# Patient Record
Sex: Female | Born: 1990 | Race: White | Hispanic: No | Marital: Single | Smoking: Never smoker
Health system: Southern US, Community
[De-identification: ages and names within clinical notes are randomized; demographics above are authoritative.]

## PROBLEM LIST (undated history)

## (undated) DIAGNOSIS — R200 Anesthesia of skin: Secondary | ICD-10-CM

## (undated) DIAGNOSIS — K219 Gastro-esophageal reflux disease without esophagitis: Secondary | ICD-10-CM

## (undated) DIAGNOSIS — F4001 Agoraphobia with panic disorder: Secondary | ICD-10-CM

## (undated) DIAGNOSIS — F329 Major depressive disorder, single episode, unspecified: Secondary | ICD-10-CM

## (undated) DIAGNOSIS — F32A Depression, unspecified: Secondary | ICD-10-CM

## (undated) DIAGNOSIS — F419 Anxiety disorder, unspecified: Secondary | ICD-10-CM

## (undated) DIAGNOSIS — R51 Headache: Secondary | ICD-10-CM

## (undated) DIAGNOSIS — R011 Cardiac murmur, unspecified: Secondary | ICD-10-CM

## (undated) DIAGNOSIS — R519 Headache, unspecified: Secondary | ICD-10-CM

## (undated) DIAGNOSIS — Z8719 Personal history of other diseases of the digestive system: Secondary | ICD-10-CM

## (undated) HISTORY — DX: Major depressive disorder, single episode, unspecified: F32.9

## (undated) HISTORY — DX: Depression, unspecified: F32.A

## (undated) HISTORY — DX: Anesthesia of skin: R20.0

## (undated) HISTORY — PX: TOOTH EXTRACTION: SUR596

## (undated) HISTORY — DX: Anxiety disorder, unspecified: F41.9

---

## 2012-12-04 ENCOUNTER — Encounter (HOSPITAL_COMMUNITY): Payer: Self-pay | Admitting: *Deleted

## 2012-12-04 ENCOUNTER — Emergency Department (HOSPITAL_COMMUNITY)
Admission: EM | Admit: 2012-12-04 | Discharge: 2012-12-04 | Disposition: A | Discharge status: Home / Self Care | Payer: Medicaid Other | Attending: Emergency Medicine | Admitting: Emergency Medicine

## 2012-12-04 DIAGNOSIS — Z880 Allergy status to penicillin: Secondary | ICD-10-CM | POA: Insufficient documentation

## 2012-12-04 DIAGNOSIS — N760 Acute vaginitis: Secondary | ICD-10-CM | POA: Insufficient documentation

## 2012-12-04 DIAGNOSIS — R350 Frequency of micturition: Secondary | ICD-10-CM | POA: Insufficient documentation

## 2012-12-04 DIAGNOSIS — Z3202 Encounter for pregnancy test, result negative: Secondary | ICD-10-CM | POA: Insufficient documentation

## 2012-12-04 DIAGNOSIS — B9689 Other specified bacterial agents as the cause of diseases classified elsewhere: Secondary | ICD-10-CM

## 2012-12-04 DIAGNOSIS — Z202 Contact with and (suspected) exposure to infections with a predominantly sexual mode of transmission: Secondary | ICD-10-CM

## 2012-12-04 DIAGNOSIS — R300 Dysuria: Secondary | ICD-10-CM | POA: Insufficient documentation

## 2012-12-04 DIAGNOSIS — Z79899 Other long term (current) drug therapy: Secondary | ICD-10-CM | POA: Insufficient documentation

## 2012-12-04 LAB — URINALYSIS, ROUTINE W REFLEX MICROSCOPIC
Bilirubin Urine: NEGATIVE
Hgb urine dipstick: NEGATIVE
Nitrite: NEGATIVE
Specific Gravity, Urine: >1.03 — ABNORMAL HIGH (ref 1.005–1.030)
pH: 6 (ref 5.0–8.0)

## 2012-12-04 LAB — WET PREP, GENITAL
Trich, Wet Prep: NONE SEEN
Yeast Wet Prep HPF POC: NONE SEEN

## 2012-12-04 LAB — URINE MICROSCOPIC-ADD ON

## 2012-12-04 MED ORDER — CEFTRIAXONE SODIUM 250 MG IJ SOLR
250.0000 mg | Freq: Once | INTRAMUSCULAR | Status: AC
  Administered 2012-12-04: 250 mg via INTRAMUSCULAR
  Filled 2012-12-04: qty 250

## 2012-12-04 MED ORDER — AZITHROMYCIN 250 MG PO TABS
1000.0000 mg | ORAL_TABLET | Freq: Once | ORAL | Status: AC
  Administered 2012-12-04: 1000 mg via ORAL
  Filled 2012-12-04: qty 4

## 2012-12-04 MED ORDER — LIDOCAINE HCL (PF) 1 % IJ SOLN
INTRAMUSCULAR | Status: AC
  Administered 2012-12-04: 2.1 mL
  Filled 2012-12-04: qty 5

## 2012-12-04 MED ORDER — METRONIDAZOLE 500 MG PO TABS: 500.0000 mg | ORAL_TABLET | Freq: Two times a day (BID) | ORAL | Status: DC

## 2012-12-04 NOTE — ED Notes (Signed)
Pt states she had unprotected sex last Saturday and now has foul smelling, yellowish discharge, itching, and urinary frequency. Cough and congestion x 1 month.

## 2012-12-04 NOTE — ED Provider Notes (Signed)
Medical screening examination/treatment/procedure(s) were performed by non-physician practitioner and as supervising physician I was immediately available for consultation/collaboration.  Niurka Benecke Joseph Analina Filla, MD 12/04/12 2239 

## 2012-12-04 NOTE — ED Provider Notes (Signed)
CSN: [id number]     Arrival date & time 12/04/12  1946 History     First MD Initiated Contact with Patient 12/04/12 1958     Chief Complaint  Patient presents with  . S74.5    (Consider location/radiation/quality/duration/timing/severity/associated sxs/prior Treatment) Patient is a 22 y.o. female presenting with vaginal discharge. The history is provided by the patient.  Vaginal Discharge Quality:  Yellow Severity:  Mild Onset quality:  Gradual Duration:  1 week Timing:  Constant Progression:  Unchanged Chronicity:  New Context: after intercourse   Context: not during urination, not genital trauma and not recent antibiotic use   Relieved by:  Nothing Worsened by:  Nothing tried Ineffective treatments:  None tried Associated symptoms: dysuria, urinary frequency and vaginal itching   Associated symptoms: no abdominal pain, no fever, no genital lesions, no nausea, no rash, no urinary hesitancy, no urinary incontinence and no vomiting   Risk factors: new sexual partner, STI exposure and unprotected sex   Risk factors: no foreign body, no gynecological surgery, no PID and no terminated pregnancy     History reviewed. No pertinent past medical history. History reviewed. No pertinent past surgical history. History reviewed. No pertinent family history. History  Substance Use Topics  . Smoking status: Never Smoker   . Smokeless tobacco: Not on file  . Alcohol Use: No   OB History   Grav Para Term Preterm Abortions TAB SAB Ect Mult Living                 Review of Systems  Constitutional: Negative for fever, chills, activity change and appetite change.  Respiratory: Negative for shortness of breath.   Cardiovascular: Negative for chest pain.  Gastrointestinal: Negative for nausea, vomiting, abdominal pain and abdominal distention.  Genitourinary: Positive for dysuria, frequency and vaginal discharge. Negative for bladder incontinence, hesitancy, hematuria, flank pain, vaginal  bleeding, difficulty urinating, genital sores, vaginal pain, menstrual problem and pelvic pain.  Musculoskeletal: Negative for back pain.  Skin: Negative for rash.  Hematological: Negative for adenopathy.  All other systems reviewed and are negative.    Allergies  Penicillins and Sulfa antibiotics  Home Medications   Current Outpatient Rx  Name  Route  Sig  Dispense  Refill  . FLUoxetine (PROZAC) 10 MG capsule   Oral   Take 30 mg by mouth daily.         . norethindrone-ethinyl estradiol (JUNEL FE,GILDESS FE,LOESTRIN FE) 1-20 MG-MCG tablet   Oral   Take 1 tablet by mouth daily.          BP 132/78  Pulse 85  Temp(Src) 98.7 F (37.1 C) (Oral)  Resp 20  Ht 5\' 4"  (1.626 m)  Wt 140 lb (63.504 kg)  BMI 24.02 kg/m2  SpO2 97% Physical Exam  Nursing note and vitals reviewed. Constitutional: She is oriented to person, place, and time. She appears well-developed and well-nourished. No distress.  HENT:  Head: Normocephalic and atraumatic.  Neck: Normal range of motion. Neck supple.  Cardiovascular: Normal rate, regular rhythm, normal heart sounds and intact distal pulses.   No murmur heard. Pulmonary/Chest: Effort normal and breath sounds normal. No respiratory distress.  Abdominal: Soft. She exhibits no distension and no mass. There is no tenderness. There is no rebound and no guarding.  Genitourinary: Uterus normal. There is no rash, tenderness, lesion or injury on the right labia. There is no rash, tenderness, lesion or injury on the left labia. Cervix exhibits no motion tenderness, no discharge and no friability.  Right adnexum displays no mass and no tenderness. Left adnexum displays no mass and no tenderness. No erythema or tenderness around the vagina. No foreign body around the vagina. Vaginal discharge found.  Mild white to yellow discharge in the vaginal vault. No cervical motion tenderness,  adnexal tenderness or masses.  No genital lesions.  Musculoskeletal: Normal  range of motion.  Neurological: She is alert and oriented to person, place, and time. She exhibits normal muscle tone. Coordination normal.  Skin: Skin is warm and dry. No erythema.    ED Course   Procedures (including critical care time)  Labs Reviewed  WET PREP, GENITAL - Abnormal; Notable for the following:    Clue Cells Wet Prep HPF POC FEW (*)    WBC, Wet Prep HPF POC MODERATE (*)    All other components within normal limits  URINALYSIS, ROUTINE W REFLEX MICROSCOPIC - Abnormal; Notable for the following:    Specific Gravity, Urine >1.030 (*)    Leukocytes, UA SMALL (*)    All other components within normal limits  URINE MICROSCOPIC-ADD ON - Abnormal; Notable for the following:    Squamous Epithelial / LPF MANY (*)    Bacteria, UA FEW (*)    All other components within normal limits  GC/CHLAMYDIA PROBE AMP  URINE CULTURE  PREGNANCY, URINE     MDM  Urine culture is pending  Patient reports history of unprotected intercourse last week and has since developed vaginal discharge with an odor.  She is concerned about possible STD. I will treat with IM Rocephin and  Zithromax.  I will also prescribe Flagyl for bacterial vaginosis.  No concerning symptoms for TOA or PID.     Vital signs are stable. Abdomen is soft and nontender. Patient is nontoxic appearing. Stable for discharge at this time. She agrees to followup with the health department or with her primary care physician.  Makynleigh Breslin L. Trisha Mangle, PA-C 12/04/12 2143

## 2012-12-06 LAB — URINE CULTURE

## 2012-12-06 LAB — GC/CHLAMYDIA PROBE AMP
CT Probe RNA: NEGATIVE
GC Probe RNA: NEGATIVE

## 2012-12-07 ENCOUNTER — Telehealth (HOSPITAL_COMMUNITY): Payer: Self-pay | Admitting: Emergency Medicine

## 2012-12-07 NOTE — ED Notes (Signed)
Post ED Visit - Positive Culture Follow-up  Culture report reviewed by antimicrobial stewardship pharmacist: []  Wes Dulaney, Pharm.D., BCPS []  Celedonio Miyamoto, Pharm.D., BCPS [x]  Georgina Pillion, [address].D., BCPS []  [place], [address].D., BCPS, AAHIVP []  Estella Husk, Pharm.D., BCPS, AAHIVP  Positive urine culture Per [facility], would recommend not treating, likely contaminant and no further patient follow-up is required at this time.  Kylie A Holland 12/07/2012, 2:34 PM

## 2012-12-07 NOTE — Progress Notes (Signed)
ED Antimicrobial Stewardship Positive Culture Follow Up   Casey Delgado is an 22 y.o. female who presented to [facility] on 12/04/2012 with a chief complaint of foul smelling urine with yellow discharge, itching, and urinary frequency  Chief Complaint  Patient presents with  . S74.5     Recent Results (from the past 720 hour(s))  URINE CULTURE     Status: None   Collection Time    12/04/12  8:00 PM      Result Value Range Status   Specimen Description URINE, CLEAN CATCH   Final   Special Requests NONE   Final   Culture  Setup Time     Final   Value: 12/04/2012 22:30     Performed at [facility] Count     Final   Value: 50,000 COLONIES/ML     Performed at [facility]   Culture     Final   Value: LACTOBACILLUS SPECIES     Note: Standardized susceptibility testing for this organism is not available.     Performed at [facility]   Report Status 12/06/2012 FINAL   Final  GC/CHLAMYDIA PROBE AMP     Status: None   Collection Time    12/04/12  8:36 PM      Result Value Range Status   CT Probe RNA NEGATIVE  NEGATIVE Final   GC Probe RNA NEGATIVE  NEGATIVE Final   Comment: (NOTE)                                                                                              **Normal Reference Range: Negative**          Assay performed using the Gen-Probe APTIMA COMBO2 (R) Assay.     Acceptable specimen types for this assay include APTIMA Swabs (Unisex,     endocervical, urethral, or vaginal), first void urine, and ThinPrep     liquid based cytology samples.     Performed at [facility]  WET PREP, GENITAL     Status: Abnormal   Collection Time    12/04/12  8:36 PM      Result Value Range Status   Yeast Wet Prep HPF POC NONE SEEN  NONE SEEN Final   Trich, Wet Prep NONE SEEN  NONE SEEN Final   Clue Cells Wet Prep HPF POC FEW (*) NONE SEEN Final   WBC, Wet Prep HPF POC MODERATE (*) NONE SEEN Final    [x]  No additional treatment  indicated  22 y.o. F who presented to the [facility] with foul smelling urine with yellow discharge, itching, and urinary frequency and was worked up for STDs. Given Flagyl to treat for BV -- UCx grew 50k of lactobacillus, UA showed many squamous cells, likely a contaminant.   New antibiotic prescription: Continue Flagyl as prescribed -- no new antibiotics needed.   ED Provider: Trixie Dredge, PA-C  [name] 12/07/2012, 1:39 PM Infectious Diseases Pharmacist Phone# [phone]

## 2015-02-02 ENCOUNTER — Ambulatory Visit (INDEPENDENT_AMBULATORY_CARE_PROVIDER_SITE_OTHER): Payer: Medicaid Other | Admitting: Neurology

## 2015-02-02 DIAGNOSIS — R4182 Altered mental status, unspecified: Secondary | ICD-10-CM | POA: Diagnosis not present

## 2015-02-02 DIAGNOSIS — R202 Paresthesia of skin: Secondary | ICD-10-CM

## 2015-02-02 NOTE — Procedures (Signed)
    History:  Casey Delgado is a 24 year old patient with a history of right-sided paresthesias, and a history of anxiety and depression, with racing thoughts. The patient is being evaluated for these issues.  This is a routine EEG. No skull defects are noted. Medications include birth control pills, and BuSpar.   EEG classification: Normal awake  Description of the recording: The background rhythms of this recording consists of a fairly well modulated medium amplitude alpha rhythm of 11 Hz that is reactive to eye opening and closure. As the record progresses, the patient appears to remain in the waking state throughout the recording. Photic stimulation was performed, resulting in a bilateral and symmetric photic driving response. Hyperventilation was also performed, resulting in a minimal buildup of the background rhythm activities without significant slowing seen. At no time during the recording does there appear to be evidence of spike or spike wave discharges or evidence of focal slowing. Throughout the recording, there appears to be excessive movement artifact. EKG monitor shows no evidence of cardiac rhythm abnormalities with a heart rate of 78.  Impression: This is a normal EEG recording in the waking state. No evidence of ictal or interictal discharges are seen. The recording is somewhat technically difficult secondary to excessive movement artifact throughout the recording.

## 2015-02-03 ENCOUNTER — Ambulatory Visit: Payer: Medicaid Other | Admitting: Neurology

## 2015-02-10 ENCOUNTER — Encounter: Payer: Self-pay | Admitting: Neurology

## 2015-02-10 ENCOUNTER — Ambulatory Visit (INDEPENDENT_AMBULATORY_CARE_PROVIDER_SITE_OTHER): Payer: Medicaid Other | Admitting: Neurology

## 2015-02-10 VITALS — BP 127/88 | HR 71 | Ht 64.0 in | Wt 133.0 lb

## 2015-02-10 DIAGNOSIS — G43709 Chronic migraine without aura, not intractable, without status migrainosus: Secondary | ICD-10-CM

## 2015-02-10 DIAGNOSIS — R202 Paresthesia of skin: Secondary | ICD-10-CM | POA: Diagnosis not present

## 2015-02-10 DIAGNOSIS — IMO0002 Reserved for concepts with insufficient information to code with codable children: Secondary | ICD-10-CM

## 2015-02-10 MED ORDER — NORTRIPTYLINE HCL 10 MG PO CAPS: ORAL_CAPSULE | ORAL | Status: DC

## 2015-02-10 MED ORDER — RIZATRIPTAN BENZOATE 5 MG PO TBDP: 5.0000 mg | ORAL_TABLET | ORAL | Status: DC | PRN

## 2015-02-10 NOTE — Progress Notes (Signed)
PATIENT: Casey Delgado DOB: October 12, 1990  Chief Complaint  Patient presents with  . Numbness    She has been having right-sided numbness and weakness.  Symptoms present for years.  She has recently had a EEG but she was unable to have a MRI due to it being denied twice by Medicaid.     HISTORICAL  Casey Delgado is a 24 years old right-handed female, seen in refer by  By her primary care nurse practitioner Casey Delgado for evaluation of numbness  In February 10 2015  She reported long-standing history of migraine, her typical migraine are bifrontal, temporal region pontine pressure headache was associated light noise sensitivity, nauseous, lasting for a few hours, worsening by movement, relieved by sleep, next  She started to have  Increased frequency of migraine to the point of daily basis since August 2016, she also complains of intermittent blurred vision, clumsiness during intense headaches,  She is concerned about possible MS   I have reviewed laboratory evaluation  In December 25 2014, normal TSH, CMP, CBC with a hemoglobin of 13 point 2, creatinine was 0.73   EEG was normal February 02 2015   she also complains significant depression anxiety symptoms, taking  BuSpar 10 mg every day without significant improvement   she also complains of intermittent paresthesia over the past 2 months, from head down, involving both left and right equally, no gait difficulty, no bowel and bladder incontinence, no strokelike symptoms, no sudden visual loss.  REVIEW OF SYSTEMS: Full 14 system review of systems performed and notable only for  As above  ALLERGIES: Allergies  Allergen Reactions  . Penicillins   . Sulfa Antibiotics     HOME MEDICATIONS: Current Outpatient Prescriptions  Medication Sig Dispense Refill  . busPIRone (BUSPAR) 10 MG tablet Take 10 mg by mouth 3 (three) times daily.    . norethindrone-ethinyl estradiol (JUNEL FE,GILDESS FE,LOESTRIN FE) 1-20 MG-MCG tablet  Take 1 tablet by mouth daily.     No current facility-administered medications for this visit.    PAST MEDICAL HISTORY: Past Medical History  Diagnosis Date  . Depression   . Anxiety   . Numbness     PAST SURGICAL HISTORY: Past Surgical History  Procedure Laterality Date  . No past surgeries      FAMILY HISTORY: Family History  Problem Relation Age of Onset  . Anxiety disorder Mother   . Alcohol abuse Father   . Depression Mother     SOCIAL HISTORY:  Social History   Social History  . Marital Status: Single    Spouse Name: N/A  . Number of Children: 2  . Years of Education: 9   Occupational History  . Unemployed    Social History Main Topics  . Smoking status: Never Smoker   . Smokeless tobacco: Not on file  . Alcohol Use: No  . Drug Use: No  . Sexual Activity: Not on file   Other Topics Concern  . Not on file   Social History Narrative   Lives at home with boyfriend and two children.   Right-handed.   No caffeine use.     PHYSICAL EXAM   Filed Vitals:   02/10/15 1530  BP: 127/88  Pulse: 71  Height:  (1.626 m)  Weight: 133 lb (60.328 kg)    Not recorded      Body mass index is 22.82 kg/(m^2).  PHYSICAL EXAMNIATION:  Gen: NAD, conversant, well nourised, obese, well groomed  Cardiovascular: Regular rate rhythm, no peripheral edema, warm, nontender. Eyes: Conjunctivae clear without exudates or hemorrhage Neck: Supple, no carotid bruise. Pulmonary: Clear to auscultation bilaterally   NEUROLOGICAL EXAM:  MENTAL STATUS: Speech:    Speech is normal; fluent and spontaneous with normal comprehension.  Cognition:     Orientation to time, place and person     Normal recent and remote memory     Normal Attention span and concentration     Normal Language, naming, repeating,spontaneous speech     Fund of knowledge   CRANIAL NERVES: CN II: Visual fields are full to confrontation. Fundoscopic exam is normal with  sharp discs and no vascular changes. Pupils are round equal and briskly reactive to light. CN III, IV, VI: extraocular movement are normal. No ptosis. CN V: Facial sensation is intact to pinprick in all 3 divisions bilaterally. Corneal responses are intact.  CN VII: Face is symmetric with normal eye closure and smile. CN VIII: Hearing is normal to rubbing fingers CN IX, X: Palate elevates symmetrically. Phonation is normal. CN XI: Head turning and shoulder shrug are intact CN XII: Tongue is midline with normal movements and no atrophy.  MOTOR: There is no pronator drift of out-stretched arms. Muscle bulk and tone are normal. Muscle strength is normal.  REFLEXES: Reflexes are 2+ and symmetric at the biceps, triceps, knees, and ankles. Plantar responses are flexor.  SENSORY: Intact to light touch, pinprick, position sense, and vibration sense are intact in fingers and toes.  COORDINATION: Rapid alternating movements and fine finger movements are intact. There is no dysmetria on finger-to-nose and heel-knee-shin.    GAIT/STANCE: Posture is normal. Gait is steady with normal steps, base, arm swing, and turning. Heel and toe walking are normal. Tandem gait is normal.  Romberg is absent.   DIAGNOSTIC DATA (LABS, IMAGING, TESTING) - I reviewed patient records, labs, notes, testing and imaging myself where available.   ASSESSMENT AND PLAN  Casey Delgado is a 24 y.o. female   Worsening chronic migraine   Add on nortriptyline  10 mg, titrating to 20 mg every night as preventative medications   Maxalt as needed  Depression anxiety   Continue  BuSpar  Paresthesia, blurry vision   She desired further evaluation proceed with MRI of the brain  Levert FeinsteinYijun Valentine Delgado, M.D. Ph.D.  [facility] [address], [address] [place], [place] [address] Ph: [phone] Fax: [phone]  CC: NP Casey KayserKatie Delgado

## 2015-03-08 ENCOUNTER — Encounter (HOSPITAL_COMMUNITY): Payer: Self-pay

## 2015-03-08 ENCOUNTER — Encounter (HOSPITAL_COMMUNITY)
Admission: RE | Admit: 2015-03-08 | Discharge: 2015-03-08 | Disposition: A | Discharge status: Home / Self Care | Payer: Medicaid Other | Source: Ambulatory Visit | Attending: Neurology | Admitting: Neurology

## 2015-03-08 DIAGNOSIS — Z01812 Encounter for preprocedural laboratory examination: Secondary | ICD-10-CM | POA: Insufficient documentation

## 2015-03-08 HISTORY — DX: Personal history of other diseases of the digestive system: Z87.19

## 2015-03-08 HISTORY — DX: Headache, unspecified: R51.9

## 2015-03-08 HISTORY — DX: Headache: R51

## 2015-03-08 HISTORY — DX: Agoraphobia with panic disorder: F40.01

## 2015-03-08 HISTORY — DX: Cardiac murmur, unspecified: R01.1

## 2015-03-08 HISTORY — DX: Gastro-esophageal reflux disease without esophagitis: K21.9

## 2015-03-08 LAB — CBC
HEMATOCRIT: 41.3 % (ref 36.0–46.0)
Hemoglobin: 14 g/dL (ref 12.0–15.0)
MCH: 29.2 pg (ref 26.0–34.0)
MCHC: 33.9 g/dL (ref 30.0–36.0)
MCV: 86.2 fL (ref 78.0–100.0)
PLATELETS: 184 10*3/uL (ref 150–400)
RBC: 4.79 MIL/uL (ref 3.87–5.11)
RDW: 12.6 % (ref 11.5–15.5)
WBC: 6.9 10*3/uL (ref 4.0–10.5)

## 2015-03-08 LAB — BASIC METABOLIC PANEL
Anion gap: 11 (ref 5–15)
BUN: 10 mg/dL (ref 6–20)
CHLORIDE: 107 mmol/L (ref 101–111)
CO2: 21 mmol/L — ABNORMAL LOW (ref 22–32)
Calcium: 9.5 mg/dL (ref 8.9–10.3)
Creatinine, Ser: 0.71 mg/dL (ref 0.44–1.00)
GFR calc non Af Amer: >60 mL/min (ref >60)
Glucose, Bld: 84 mg/dL (ref 65–99)
POTASSIUM: 4.8 mmol/L (ref 3.5–5.1)
SODIUM: 139 mmol/L (ref 135–145)

## 2015-03-08 LAB — HCG, SERUM, QUALITATIVE: Preg, Serum: NEGATIVE

## 2015-03-08 NOTE — Progress Notes (Signed)
Pt denies SOB, chest pain, and being under the care of a cardiologist. Pt denies having a stress test, echo and cardiac cath. Pt denies having an EKG and chest x ray within the last year. Spoke with Revonda StandardAllison, PA, anesthesia,  regarding pre-op labs; okay to do BMP.

## 2015-03-08 NOTE — Progress Notes (Signed)
Pt stated that she takes all medications at HS.

## 2015-03-08 NOTE — Pre-Procedure Instructions (Signed)
    Casey Delgado  03/08/2015      [id number] - [place], [place] - [address] AT [facility] [address] [place] [id number] Phone: [phone] Fax: [phone]  [id number] - [place], [place] - [address] [facility] [address] [place] [address] Phone: [phone] Fax: [phone]    Your procedure is scheduled on Tuesday, March 15, 2015  [facility] at 8:00 A.M.  Call this number if you have problems the morning of surgery:  [phone]   Remember:  Do not eat food or drink liquids after midnight Monday, March 14, 2015  Take these medicines the morning of surgery with A SIP OF WATER : None   Do not take any vitamins or herbal medications; stop now.   Do not wear jewelry, make-up or nail polish.  Do not wear lotions, powders, or perfumes.  You may not wear deodorant.  Do not shave 48 hours prior to surgery.    Do not bring valuables to the hospital.  [facility] is not responsible for any belongings or valuables.  Contacts, dentures or bridgework may not be worn into surgery.  Leave your suitcase in the car.  After surgery it may be brought to your room.  For patients admitted to the hospital, discharge time will be determined by your treatment team.  Patients discharged the day of surgery will not be allowed to drive home.   Name and phone number of your driver: Special instructions:   Please read over the following fact sheets that you were given. Pain Booklet and Coughing and Deep Breathing

## 2015-03-15 ENCOUNTER — Ambulatory Visit (HOSPITAL_COMMUNITY)
Admission: RE | Admit: 2015-03-15 | Discharge: 2015-03-15 | Disposition: A | Discharge status: Home / Self Care | Payer: Medicaid Other | Source: Ambulatory Visit | Attending: Neurology | Admitting: Neurology

## 2015-03-15 ENCOUNTER — Ambulatory Visit (HOSPITAL_COMMUNITY): Payer: Medicaid Other | Admitting: Anesthesiology

## 2015-03-15 ENCOUNTER — Telehealth: Payer: Self-pay | Admitting: *Deleted

## 2015-03-15 ENCOUNTER — Encounter (HOSPITAL_COMMUNITY)
Admission: RE | Disposition: A | Discharge status: Home / Self Care | Payer: Self-pay | Source: Ambulatory Visit | Attending: Neurology

## 2015-03-15 ENCOUNTER — Encounter (HOSPITAL_COMMUNITY): Payer: Self-pay | Admitting: Certified Registered Nurse Anesthetist

## 2015-03-15 ENCOUNTER — Ambulatory Visit (HOSPITAL_COMMUNITY): Payer: Medicaid Other | Admitting: Vascular Surgery

## 2015-03-15 DIAGNOSIS — Z793 Long term (current) use of hormonal contraceptives: Secondary | ICD-10-CM | POA: Insufficient documentation

## 2015-03-15 DIAGNOSIS — G43709 Chronic migraine without aura, not intractable, without status migrainosus: Secondary | ICD-10-CM

## 2015-03-15 DIAGNOSIS — IMO0002 Reserved for concepts with insufficient information to code with codable children: Secondary | ICD-10-CM

## 2015-03-15 DIAGNOSIS — R202 Paresthesia of skin: Secondary | ICD-10-CM | POA: Diagnosis present

## 2015-03-15 DIAGNOSIS — F418 Other specified anxiety disorders: Secondary | ICD-10-CM | POA: Diagnosis not present

## 2015-03-15 HISTORY — PX: RADIOLOGY WITH ANESTHESIA: SHX6223

## 2015-03-15 SURGERY — RADIOLOGY WITH ANESTHESIA
Anesthesia: General

## 2015-03-15 MED ORDER — LACTATED RINGERS IV SOLN
INTRAVENOUS | Status: DC
  Administered 2015-03-15: 09:00:00 via INTRAVENOUS

## 2015-03-15 MED ORDER — PROMETHAZINE HCL 25 MG/ML IJ SOLN: 6.2500 mg | INTRAMUSCULAR | Status: DC | PRN

## 2015-03-15 NOTE — Addendum Note (Signed)
Addendum  created 03/15/15 1601 by Issiac Jamar P Kaseem Vastine, CRNA   Modules edited: Anesthesia Attestations    

## 2015-03-15 NOTE — Anesthesia Postprocedure Evaluation (Signed)
Anesthesia Post Note  Patient: Casey Delgado  Procedure(s) Performed: Procedure(s) (LRB): MRI BRAIN WITHOUT (N/A)  Patient location during evaluation: PACU Anesthesia Type: General Level of consciousness: awake and alert Pain management: pain level controlled Vital Signs Assessment: post-procedure vital signs reviewed and stable Respiratory status: spontaneous breathing and nonlabored ventilation Cardiovascular status: blood pressure returned to baseline Postop Assessment: no signs of nausea or vomiting Anesthetic complications: no    Last Vitals:  Filed Vitals:   03/15/15 1230 03/15/15 1245  BP: 134/76 128/81  Pulse: 91 82  Temp: 36.8 C   Resp: 14     Last Pain:  Filed Vitals:   03/15/15 1250  PainSc: 0-No pain                 Kennieth RadFitzgerald, Rey Fors E

## 2015-03-15 NOTE — Transfer of Care (Signed)
Immediate Anesthesia Transfer of Care Note  Patient: Casey ManeLauren M Berninger  Procedure(s) Performed: Procedure(s): MRI BRAIN WITHOUT (N/A)  Patient Location: PACU  Anesthesia Type:General  Level of Consciousness: awake, alert , oriented and patient cooperative  Airway & Oxygen Therapy: Patient Spontanous Breathing and Patient connected to nasal cannula oxygen  Post-op Assessment: Report given to RN, Post -op Vital signs reviewed and stable and Patient moving all extremities X 4  Post vital signs: Reviewed and stable  Last Vitals:  Filed Vitals:   03/15/15 0811 03/15/15 1204  BP: 144/86   Pulse: 83   Temp: 36.4 C 36.8 C  Resp: 20 15    Complications: No apparent anesthesia complications

## 2015-03-15 NOTE — Telephone Encounter (Signed)
Left patient a message on her personal number that her results are normal.

## 2015-03-15 NOTE — Anesthesia Preprocedure Evaluation (Addendum)
Anesthesia Evaluation  Patient identified by MRN, date of birth, ID band Patient awake    Reviewed: Allergy & Precautions, NPO status , Patient's Chart, lab work & pertinent test results  Airway Mallampati: I  TM Distance: >3 FB Neck ROM: Full    Dental   Pulmonary neg pulmonary ROS,    breath sounds clear to auscultation       Cardiovascular negative cardio ROS   Rhythm:Regular Rate:Normal     Neuro/Psych  Headaches, Anxiety Depression paresthesia    GI/Hepatic Neg liver ROS, hiatal hernia, GERD  Controlled,  Endo/Other  negative endocrine ROS  Renal/GU negative Renal ROS     Musculoskeletal   Abdominal   Peds  Hematology negative hematology ROS (+)   Anesthesia Other Findings   Reproductive/Obstetrics                           Anesthesia Physical Anesthesia Plan  ASA: II  Anesthesia Plan: General   Post-op Pain Management:    Induction: Intravenous  Airway Management Planned: LMA  Additional Equipment:   Intra-op Plan:   Post-operative Plan: Extubation in OR  Informed Consent: I have reviewed the patients History and Physical, chart, labs and discussed the procedure including the risks, benefits and alternatives for the proposed anesthesia with the patient or authorized representative who has indicated his/her understanding and acceptance.   Dental advisory given  Plan Discussed with: CRNA and Anesthesiologist  Anesthesia Plan Comments:        Anesthesia Quick Evaluation

## 2015-03-15 NOTE — Telephone Encounter (Signed)
-----   Message from Levert FeinsteinYijun Yan, MD sent at 03/15/2015  1:21 PM EST ----- Please call pt for normal MRI brain.

## 2015-03-16 ENCOUNTER — Encounter (HOSPITAL_COMMUNITY): Payer: Self-pay | Admitting: Radiology

## 2015-03-16 MED FILL — Ephedrine Sulf-NaCl Soln Pref Syr 50 MG/10ML-0.9% (5 MG/ML): INTRAVENOUS | Qty: 10 | Status: AC

## 2015-03-16 MED FILL — Lidocaine HCl IV Inj 20 MG/ML: INTRAVENOUS | Qty: 5 | Status: AC

## 2015-03-16 MED FILL — Propofol IV Emul 200 MG/20ML (10 MG/ML): INTRAVENOUS | Qty: 20 | Status: AC

## 2015-03-16 MED FILL — Midazolam HCl Inj 2 MG/2ML (Base Equivalent): INTRAMUSCULAR | Qty: 2 | Status: AC

## 2015-03-24 ENCOUNTER — Ambulatory Visit: Payer: Medicaid Other | Admitting: Neurology

## 2015-06-13 DIAGNOSIS — Z0271 Encounter for disability determination: Secondary | ICD-10-CM

## 2017-02-04 IMAGING — MR MR HEAD W/O CM
9 of 10 series · 35 of 48 positions shown · non-contrast
Comparison: None.

CLINICAL DATA: Paresthesias.  Chronic migraines.

EXAM:
MRI HEAD WITHOUT CONTRAST
TECHNIQUE: Multiplanar, multiecho pulse sequences of the brain and surrounding
structures were obtained without intravenous contrast.

[Series 2: FLAIR · sagittal · 5.0mm · 0.47mm/px · 1 of 23 slices shown (1 of 2)]
[im 1/23]
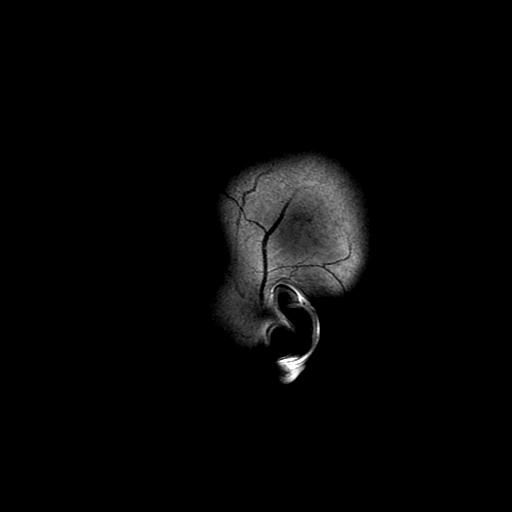

[Series 4: DWI · axial · 3.6mm · 0.94mm/px · z∈[-80,+73]mm · 9 of 88 slices shown (1 of 4)]
[im 1/88]
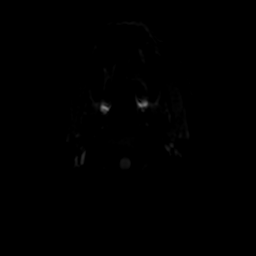
[im 11/88]
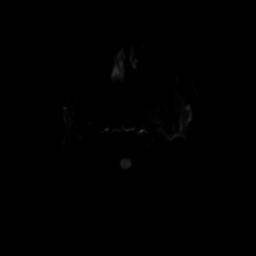
[im 22/88]
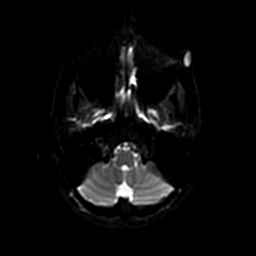
[im 33/88]
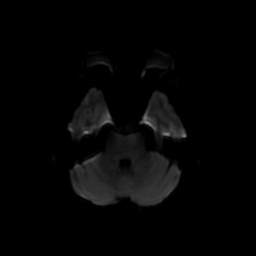
[im 44/88]
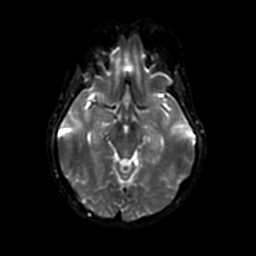
[im 55/88]
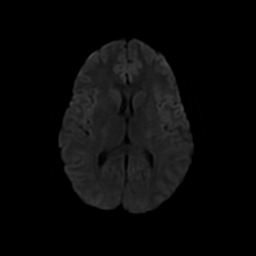
[im 66/88]
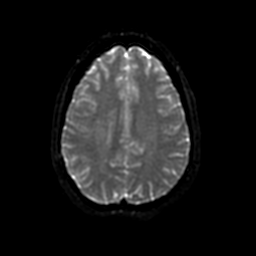
[im 77/88]
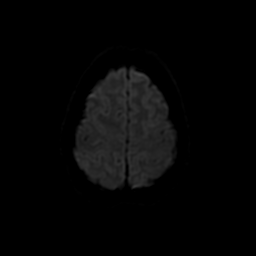
[im 88/88]
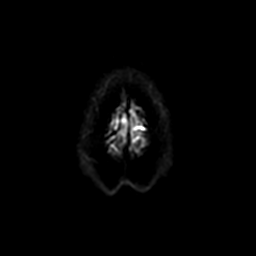

[Series 5: T2 · axial · 5.0mm · 0.47mm/px · z∈[-78,+70]mm · 3 of 26 slices shown (1 of 2)]
[im 1/26]
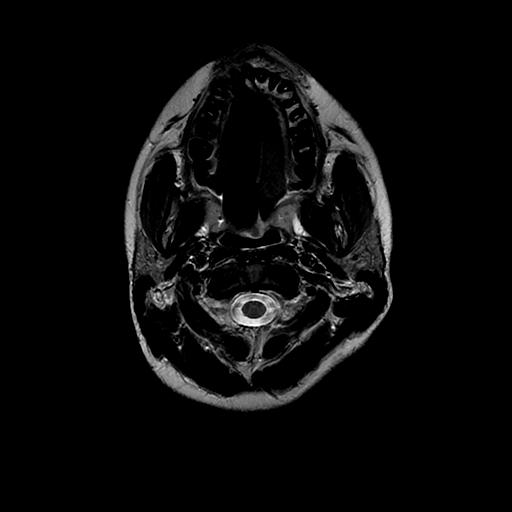
[im 13/26]
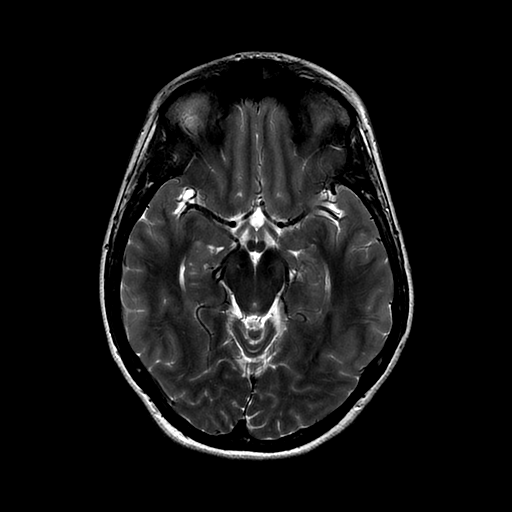
[im 26/26]
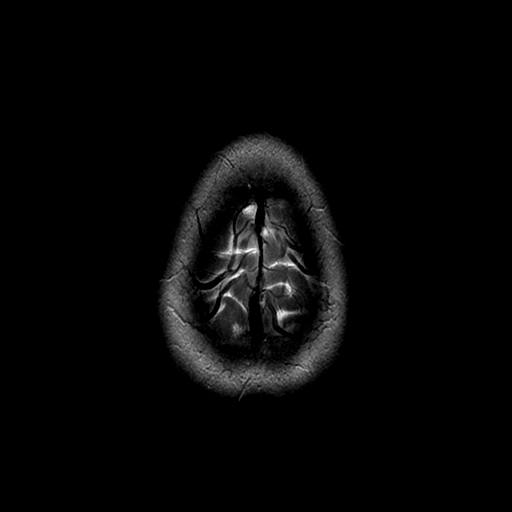

[Series 6: FLAIR · axial · 5.0mm · 0.47mm/px · z∈[-78,+70]mm · 3 of 26 slices shown (2 of 2)]
[im 1/26]
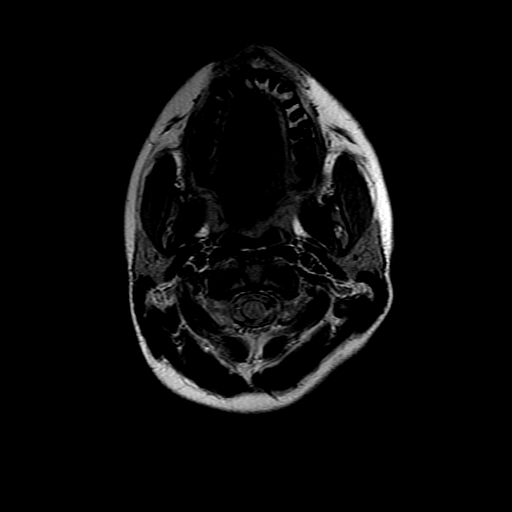
[im 13/26]
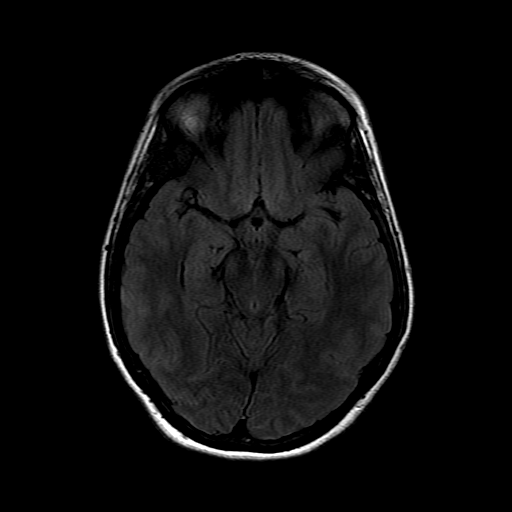
[im 26/26]
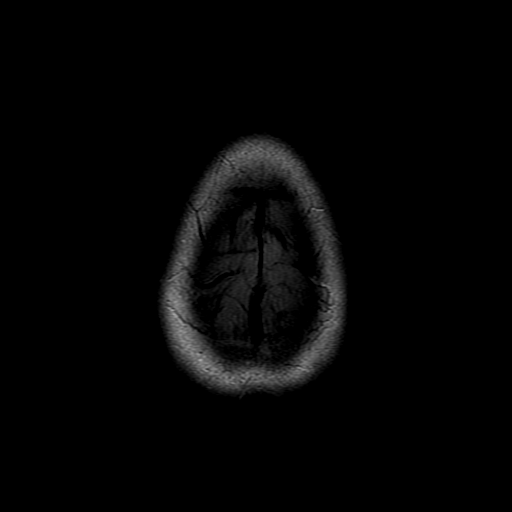

[Series 8: DWI · coronal · 5.0mm · 0.94mm/px · 7 of 68 slices shown (2 of 4)]
[im 1/68]
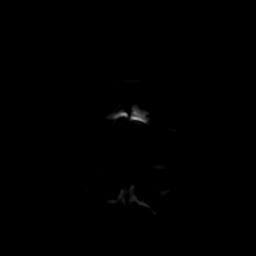
[im 12/68]
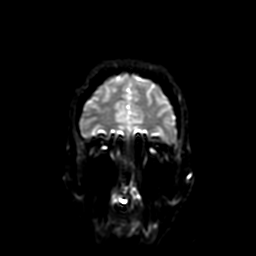
[im 23/68]
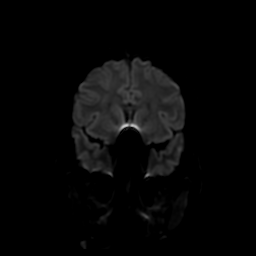
[im 34/68]
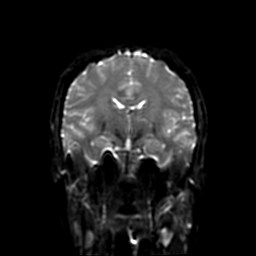
[im 45/68]
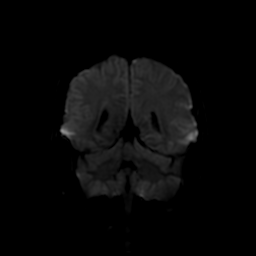
[im 56/68]
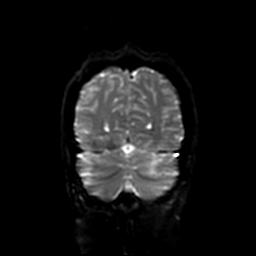
[im 68/68]
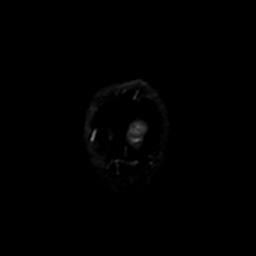

[Series 10: (person_name) · axial · 3.0mm · 0.47mm/px · z∈[-79,-63]mm · 2 of 104 slices shown]
[im 1/104]
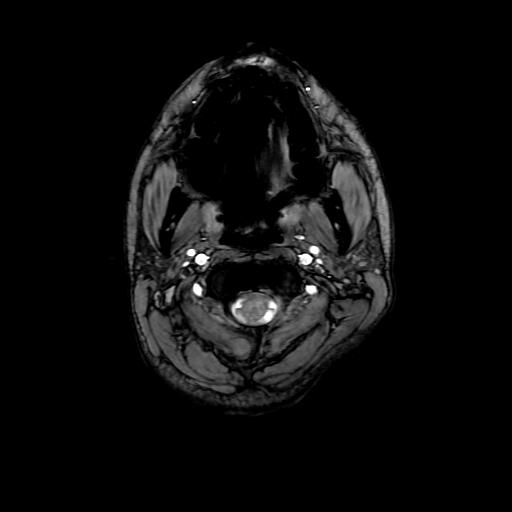
[im 12/104]
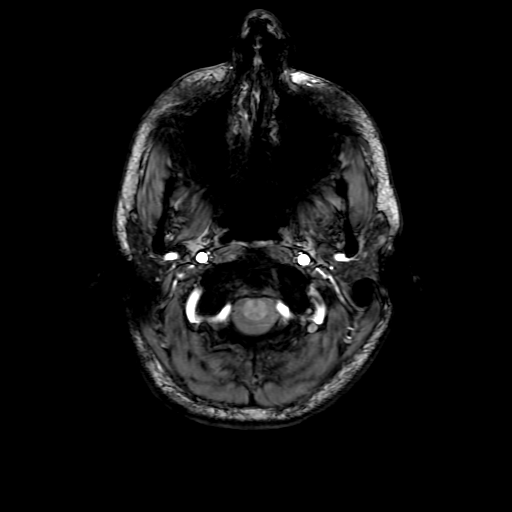

[Series 12: T2 · coronal · 5.0mm · 0.39mm/px · 3 of 29 slices shown (2 of 2)]
[im 1/29]
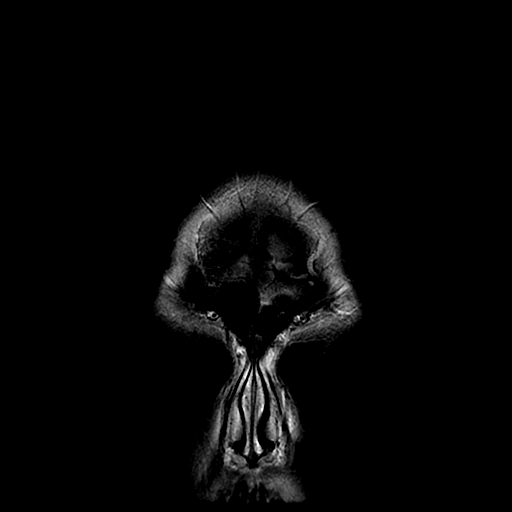
[im 15/29]
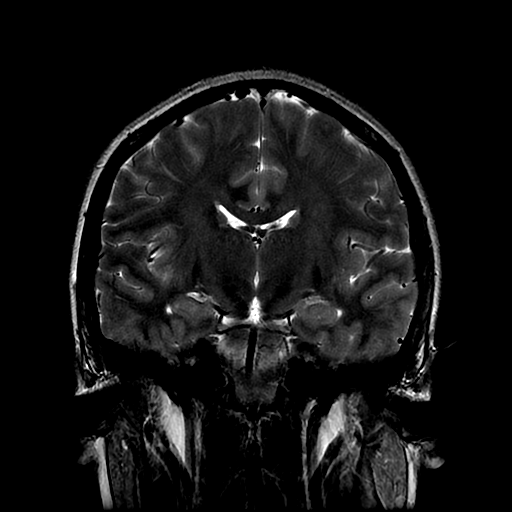
[im 29/29]
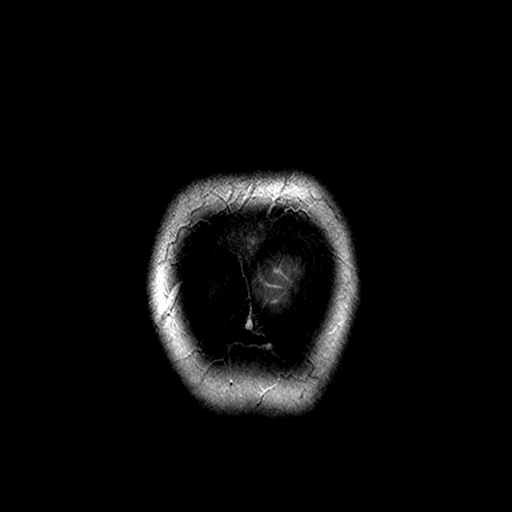

[Series 400: DWI · axial · 3.6mm · 0.94mm/px · z∈[-80,+73]mm · 4 of 44 slices shown (3 of 4)]
[im 1/44]
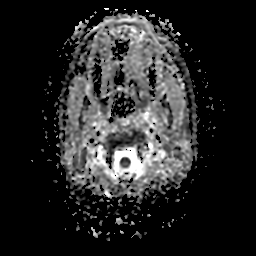
[im 15/44]
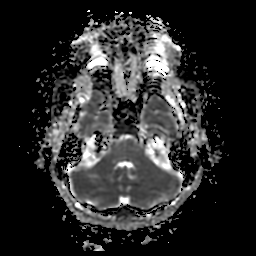
[im 29/44]
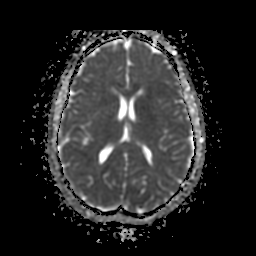
[im 44/44]
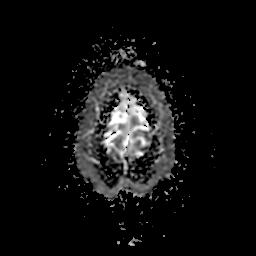

[Series 800: DWI · coronal · 5.0mm · 0.94mm/px · 3 of 34 slices shown (4 of 4)]
[im 1/34]
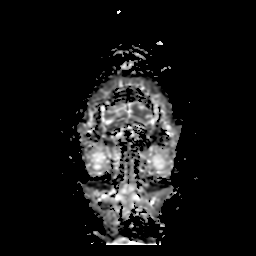
[im 17/34]
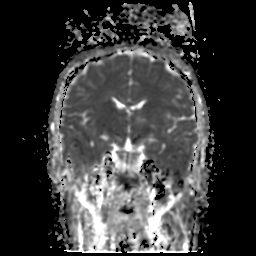
[im 34/34]
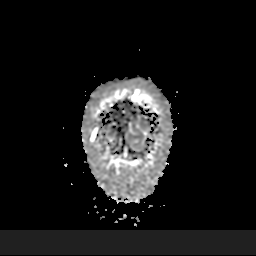

[35 of 48 positions shown; findings below may reference images not displayed]

FINDINGS: The study was performed under general anesthesia. Mild sulcal FLAIR
hyperintensity is likely related to hyperoxygenation.

There is no evidence of acute infarct, intracranial hemorrhage,
mass, midline shift, or extra-axial fluid collection. Ventricles and
sulci are normal in size and configuration. The brain is normal in
signal.

Orbits are unremarkable. No significant disease is seen in the
paranasal sinuses or mastoid air cells. Major intracranial vascular
flow voids are preserved. A 10 mm oval, T1 hypointense, T2
hyperintense subcutaneous lesion in the left cheek may represent a
sebaceous cyst.
IMPRESSION: Unremarkable appearance of the brain.

## 2017-08-25 ENCOUNTER — Emergency Department (HOSPITAL_COMMUNITY)
Admission: EM | Admit: 2017-08-25 | Discharge: 2017-08-26 | Disposition: A | Discharge status: Home / Self Care | Payer: Medicaid Other | Attending: Emergency Medicine | Admitting: Emergency Medicine

## 2017-08-25 ENCOUNTER — Encounter (HOSPITAL_COMMUNITY): Payer: Self-pay | Admitting: *Deleted

## 2017-08-25 ENCOUNTER — Other Ambulatory Visit: Payer: Self-pay

## 2017-08-25 DIAGNOSIS — Z79899 Other long term (current) drug therapy: Secondary | ICD-10-CM | POA: Insufficient documentation

## 2017-08-25 DIAGNOSIS — K2210 Ulcer of esophagus without bleeding: Secondary | ICD-10-CM

## 2017-08-25 DIAGNOSIS — R1310 Dysphagia, unspecified: Secondary | ICD-10-CM | POA: Diagnosis present

## 2017-08-25 MED ORDER — SUCRALFATE 1 GM/10ML PO SUSP
1.0000 g | Freq: Once | ORAL | Status: AC
  Administered 2017-08-26: 1 g via ORAL
  Filled 2017-08-25: qty 10

## 2017-08-25 MED ORDER — PANTOPRAZOLE SODIUM 40 MG PO TBEC
40.0000 mg | DELAYED_RELEASE_TABLET | Freq: Once | ORAL | Status: AC
  Administered 2017-08-26: 40 mg via ORAL
  Filled 2017-08-25: qty 1

## 2017-08-25 MED ORDER — PANTOPRAZOLE SODIUM 40 MG PO TBEC: 40.0000 mg | DELAYED_RELEASE_TABLET | Freq: Every day | ORAL | 0 refills | Status: AC

## 2017-08-25 MED ORDER — SUCRALFATE 1 GM/10ML PO SUSP: 1.0000 g | Freq: Three times a day (TID) | ORAL | 1 refills | Status: AC

## 2017-08-25 NOTE — ED Triage Notes (Signed)
Pt states that she was placed on antibiotics for dental work and feels like one of the medications got "stuck" when she was taking the medication 4 days ago, pt states that she is able to eat and drink but continues to have "pulling" pain and continues to feel like the pill is located mid center chest area and mid back area. Pain is worse with swallowing and deep breaths,

## 2017-08-25 NOTE — ED Provider Notes (Signed)
[facility] EMERGENCY DEPARTMENT Provider Note   CSN: [id number] Arrival date & time: 08/25/17  2105     History   Chief Complaint Chief Complaint  Patient presents with  . Foreign Body    HPI Casey Delgado is a 27 y.o. female.  The history is provided by the patient.  She has history of [place] phobia, GERD, depression and comes in because of pain in her esophagus.  She had been put on clindamycin following dental extraction 5 days ago.  The next day, it felt like a pill got stuck in her esophagus.  Since then, she has had pain with swallowing.  She tried taking another pill the next day, and it also felt like it got stuck.  She has been switched to the liquid form of clindamycin.  She continues to have pain with swallowing anything-even water.  She denies any difficulty breathing.  There has been no difficulty swallowing, chest pain.  Past Medical History:  Diagnosis Date  . Agoraphobia with panic attacks   . Anxiety   . Depression   . GERD (gastroesophageal reflux disease)   . Headache    migraines  . Heart murmur    " as a baby, I outgrew it."  . History of hiatal hernia   . Numbness     There are no active problems to display for this patient.   Past Surgical History:  Procedure Laterality Date  . RADIOLOGY WITH ANESTHESIA N/A 03/15/2015   Procedure: MRI BRAIN WITHOUT;  Surgeon: Medication Radiologist, MD;  Location: [facility] OR;  Service: Radiology;  Laterality: N/A;  . TOOTH EXTRACTION       OB History   None      Home Medications    Prior to Admission medications   Medication Sig Start Date End Date Taking? Authorizing Provider  busPIRone (BUSPAR) 10 MG tablet Take 10 mg by mouth at bedtime.     [provider]  norethindrone-ethinyl estradiol (JUNEL FE,GILDESS FE,LOESTRIN FE) 1-20 MG-MCG tablet Take 1 tablet by mouth daily.    [provider]  pantoprazole (PROTONIX) 40 MG tablet Take 1 tablet (40 mg total) by mouth daily. 08/25/17    Dione Booze, MD  sucralfate (CARAFATE) 1 GM/10ML suspension Take 10 mLs (1 g total) by mouth 4 (four) times daily -  with meals and at bedtime. 08/25/17   Dione Booze, MD    Family History Family History  Problem Relation Age of Onset  . Anxiety disorder Mother   . Depression Mother   . Alcohol abuse Father     Social History Social History   Tobacco Use  . Smoking status: Never Smoker  . Smokeless tobacco: Never Used  Substance Use Topics  . Alcohol use: No  . Drug use: No     Allergies   Penicillins and Sulfa antibiotics   Review of Systems Review of Systems  All other systems reviewed and are negative.    Physical Exam Updated Vital Signs BP (!) 146/84   Pulse (!) 102   Temp 98.5 F (36.9 C) (Oral)   Resp 20   Ht  (1.626 m)   Wt 58.7 kg (129 lb 7 oz)   SpO2 96%   BMI 22.22 kg/m   Physical Exam  Nursing note and vitals reviewed.  26 year old female, resting comfortably and in no acute distress. Vital signs are significant for elevated systolic blood pressure, and borderline elevated heart rate. Oxygen saturation is 96%, which is normal. Head  is normocephalic and atraumatic. PERRLA, EOMI. Oropharynx is clear. Neck is nontender and supple without adenopathy or JVD. Back is nontender and there is no CVA tenderness. Lungs are clear without rales, wheezes, or rhonchi. Chest is nontender. Heart has regular rate and rhythm without murmur. Abdomen is soft, flat, nontender without masses or hepatosplenomegaly and peristalsis is normoactive. Extremities have no cyanosis or edema, full range of motion is present. Skin is warm and dry without rash. Neurologic: Mental status is normal, cranial nerves are intact, there are no motor or sensory deficits.  ED Treatments / Results   Procedures Procedures (including critical care time)  Medications Ordered in ED Medications  sucralfate (CARAFATE) 1 GM/10ML suspension 1 g (has no administration in time range)    pantoprazole (PROTONIX) EC tablet 40 mg (has no administration in time range)     Initial Impression / Assessment and Plan / ED Course  I have reviewed the triage vital signs and the nursing notes.  Odynophagia secondary to esophageal ulceration from clindamycin capsule.  Patient was reassured of benign nature of the condition.  She is discharged with prescriptions for pantoprazole and sucralfate suspension.  Return precautions discussed.  Since she has been switched to liquid form of the medication, she can continue taking the antibiotic.  Old records are reviewed, and she has no relevant past visits.  Final Clinical Impressions(s) / ED Diagnoses   Final diagnoses:  Ulcer of esophagus without bleeding    ED Discharge Orders        Ordered    pantoprazole (PROTONIX) 40 MG tablet  Daily     08/25/17 2342    sucralfate (CARAFATE) 1 GM/10ML suspension  3 times daily with meals & bedtime     08/25/17 2342       Dione Booze, MD 08/25/17 2347

## 2017-08-25 NOTE — Discharge Instructions (Addendum)
Try taking some Pepto Bismol before eating.   Continue taking all your current medications.  Return if you are having any problems.

## 2017-10-28 ENCOUNTER — Ambulatory Visit (HOSPITAL_COMMUNITY): Payer: Medicaid Other | Admitting: Psychiatry

## 2021-01-24 ENCOUNTER — Encounter: Payer: Self-pay | Admitting: Internal Medicine

## 2021-06-14 ENCOUNTER — Encounter: Payer: Self-pay | Admitting: Internal Medicine

## 2021-06-14 ENCOUNTER — Ambulatory Visit: Payer: Medicaid Other | Admitting: Gastroenterology
